# Patient Record
Sex: Female | Born: 1995 | Race: Black or African American | Hispanic: No | Marital: Single | Smoking: Never smoker
Health system: Southern US, Community
[De-identification: ages and names within clinical notes are randomized; demographics above are authoritative.]

---
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
## 2014-01-15 ENCOUNTER — Encounter (HOSPITAL_COMMUNITY): Payer: Self-pay | Admitting: Emergency Medicine

## 2014-01-15 ENCOUNTER — Emergency Department (INDEPENDENT_AMBULATORY_CARE_PROVIDER_SITE_OTHER)
Admission: EM | Admit: 2014-01-15 | Discharge: 2014-01-15 | Disposition: A | Discharge status: Home / Self Care | Payer: Medicaid Other | Source: Home / Self Care | Attending: Family Medicine | Admitting: Family Medicine

## 2014-01-15 DIAGNOSIS — R599 Enlarged lymph nodes, unspecified: Secondary | ICD-10-CM

## 2014-01-15 DIAGNOSIS — R591 Generalized enlarged lymph nodes: Secondary | ICD-10-CM

## 2014-01-15 DIAGNOSIS — R198 Other specified symptoms and signs involving the digestive system and abdomen: Secondary | ICD-10-CM

## 2014-01-15 LAB — POCT RAPID STREP A: Streptococcus, Group A Screen (Direct): NEGATIVE

## 2014-01-15 MED ORDER — POLYETHYLENE GLYCOL 3350 17 G PO PACK: 17.0000 g | PACK | Freq: Every day | ORAL | Status: AC

## 2014-01-15 NOTE — ED Provider Notes (Signed)
Medical screening examination/treatment/procedure(s) were performed by a resident physician or non-physician practitioner and as the supervising physician I was immediately available for consultation/collaboration.  Sahar Ryback, MD    Rosely Fernandez S Aseret Hoffman, MD 01/15/14 2119 

## 2014-01-15 NOTE — Discharge Instructions (Signed)
Take ibuprofen as needed for the sore lymph node in your neck. Come back in one week if this has not resolved.  Cervical Adenitis You have a swollen lymph gland in your neck. This commonly happens with Strep and virus infections, dental problems, insect bites, and injuries about the face, scalp, or neck. The lymph glands swell as the body fights the infection or heals the injury. Swelling and firmness typically lasts for several weeks after the infection or injury is healed. Rarely lymph glands can become swollen because of cancer or TB. Antibiotics are prescribed if there is evidence of an infection. Sometimes an infected lymph gland becomes filled with pus. This condition may require opening up the abscessed gland by draining it surgically. Most of the time infected glands return to normal within two weeks. Do not poke or squeeze the swollen lymph nodes. That may keep them from shrinking back to their normal size. If the lymph gland is still swollen after 2 weeks, further medical evaluation is needed.  SEEK IMMEDIATE MEDICAL CARE IF:  You have difficulty swallowing or breathing, increased swelling, severe pain, or a high fever.  Document Released: 09/11/2005 Document Revised: 12/04/2011 Document Reviewed: 03/03/2007 [facility] Patient Information 2014 [employer], [place].

## 2014-01-15 NOTE — ED Provider Notes (Signed)
CSN: [id number]     Arrival date & time 01/15/14  1003 History   First MD Initiated Contact with Patient 01/15/14 1048     Chief Complaint  Patient presents with  . Sore Throat   (Consider location/radiation/quality/duration/timing/severity/associated sxs/prior Treatment) HPI Comments: 18 year old female presents for evaluation of sore throat and for constipation and diarrhea. She has had a mild sore throat and a swollen lymph node on the right side of her neck for one week. She also is having some postnasal drip with very mild sinus pressure. She does not feel particularly sick and has not had any fever, chills, vomiting, cough, shortness of breath. She also has noted some alternating constipation and diarrhea for one week. She feels constipated all the time and has to strain to have a bowel movement. She had a very small amount of blood on the toilet paper one time after straining very hard but has had no blood otherwise. She has some mild cramping abdominal pain because of her period.  Patient is a 18 y.o. female presenting with pharyngitis.  Sore Throat Associated symptoms include abdominal pain.    History reviewed. No pertinent past medical history. History reviewed. No pertinent past surgical history. History reviewed. No pertinent family history. History  Substance Use Topics  . Smoking status: Never Smoker   . Smokeless tobacco: Not on file  . Alcohol Use: No   OB History   Grav Para Term Preterm Abortions TAB SAB Ect Mult Living                 Review of Systems  HENT: Positive for postnasal drip, sinus pressure and sore throat.   Gastrointestinal: Positive for abdominal pain, diarrhea, constipation and anal bleeding. Negative for nausea, vomiting, blood in stool and rectal pain.  Hematological: Positive for adenopathy.  All other systems reviewed and are negative.   Allergies  Review of patient's allergies indicates no known allergies.  Home Medications   Prior to  Admission medications   Medication Sig Start Date End Date Taking? Authorizing Provider  traMADol (ULTRAM) 50 MG tablet Take 1 tablet (50 mg total) by mouth every 6 (six) hours as needed. 12/15/13   Graylon GoodZachary H Mazey Mantell, PA-C   BP 126/73  Pulse 75  Temp(Src) 98.6 F (37 C) (Oral)  Resp 14  SpO2 99%  LMP 01/15/2014 Physical Exam  Nursing note and vitals reviewed. Constitutional: She is oriented to person, place, and time. Vital signs are normal. She appears well-developed and well-nourished. No distress.  HENT:  Head: Normocephalic and atraumatic.  Right Ear: External ear normal.  Left Ear: External ear normal.  Nose: Nose normal.  Mouth/Throat: Oropharynx is clear and moist. No oropharyngeal exudate.  Eyes: Conjunctivae are normal. Right eye exhibits no discharge.  Neck: Normal range of motion. Neck supple.  Pulmonary/Chest: Effort normal and breath sounds normal. No respiratory distress.  Abdominal: Soft. She exhibits no mass. There is no tenderness. There is no rebound and no guarding.  Lymphadenopathy:       Head (right side): Tonsillar adenopathy present.       Head (left side): No tonsillar adenopathy present.    She has cervical adenopathy (mild).       Right cervical: Posterior cervical adenopathy present.       Left cervical: Posterior cervical adenopathy present.  Neurological: She is alert and oriented to person, place, and time. She has normal strength. Coordination normal.  Skin: Skin is warm and dry. No rash noted. She is not  diaphoretic.  Psychiatric: She has a normal mood and affect. Judgment normal.    ED Course  Procedures (including critical care time) Labs Review Labs Reviewed  POCT RAPID STREP A ([facility] ONLY)    Results for orders placed during the hospital encounter of 01/15/14  POCT RAPID STREP A ([facility] ONLY)      Result Value Ref Range   Streptococcus, Group A Screen (Direct) NEGATIVE  NEGATIVE   Imaging Review No results found.   MDM    1. Lymphadenopathy   2. Alternating constipation and diarrhea    Right Tonsillar node is minimally tender. It is nonfluctuant and there is no surrounding erythema or induration. There is no signs of any other upper respiratory infection. Treat symptomatically with ibuprofen, followup if this has not resolved in one week.   Her main issue is with the constipation, treated with MiraLAX.   Followup when necessary  Meds ordered this encounter  Medications  . polyethylene glycol (MIRALAX) packet    Sig: Take 17 g by mouth daily.    Dispense:  10 each    Refill:  0    Order Specific Question:  Supervising Provider    Answer:  Clementeen GrahamOREY, EVAN, Kathie RhodesS [[id number]]      Graylon GoodZachary H Henritta Mutz, PA-C 01/15/14 1112

## 2014-01-15 NOTE — ED Notes (Signed)
Pt  Reports  Symptoms  Of   sorethroat       Sinus  Drainage  As  Well  As  intermittant  Constipation /  Diarrhea        Pt  Reports  The  Symptoms  For  About 1  Week     She  Is  Sitting  Upright  On the  Exam table  Speaking in  Complete  sentances  And  Is  In no  Distress

## 2014-01-17 LAB — CULTURE, GROUP A STREP

## 2015-08-16 IMAGING — CR DG FOREARM 2V*R*
2 series · 2 of 2 positions shown · non-contrast
Comparison: None.

CLINICAL DATA: 18-year-old female with forearm pain.

EXAM:
RIGHT FOREARM - 2 VIEW

[view not recorded (1 of 2)]
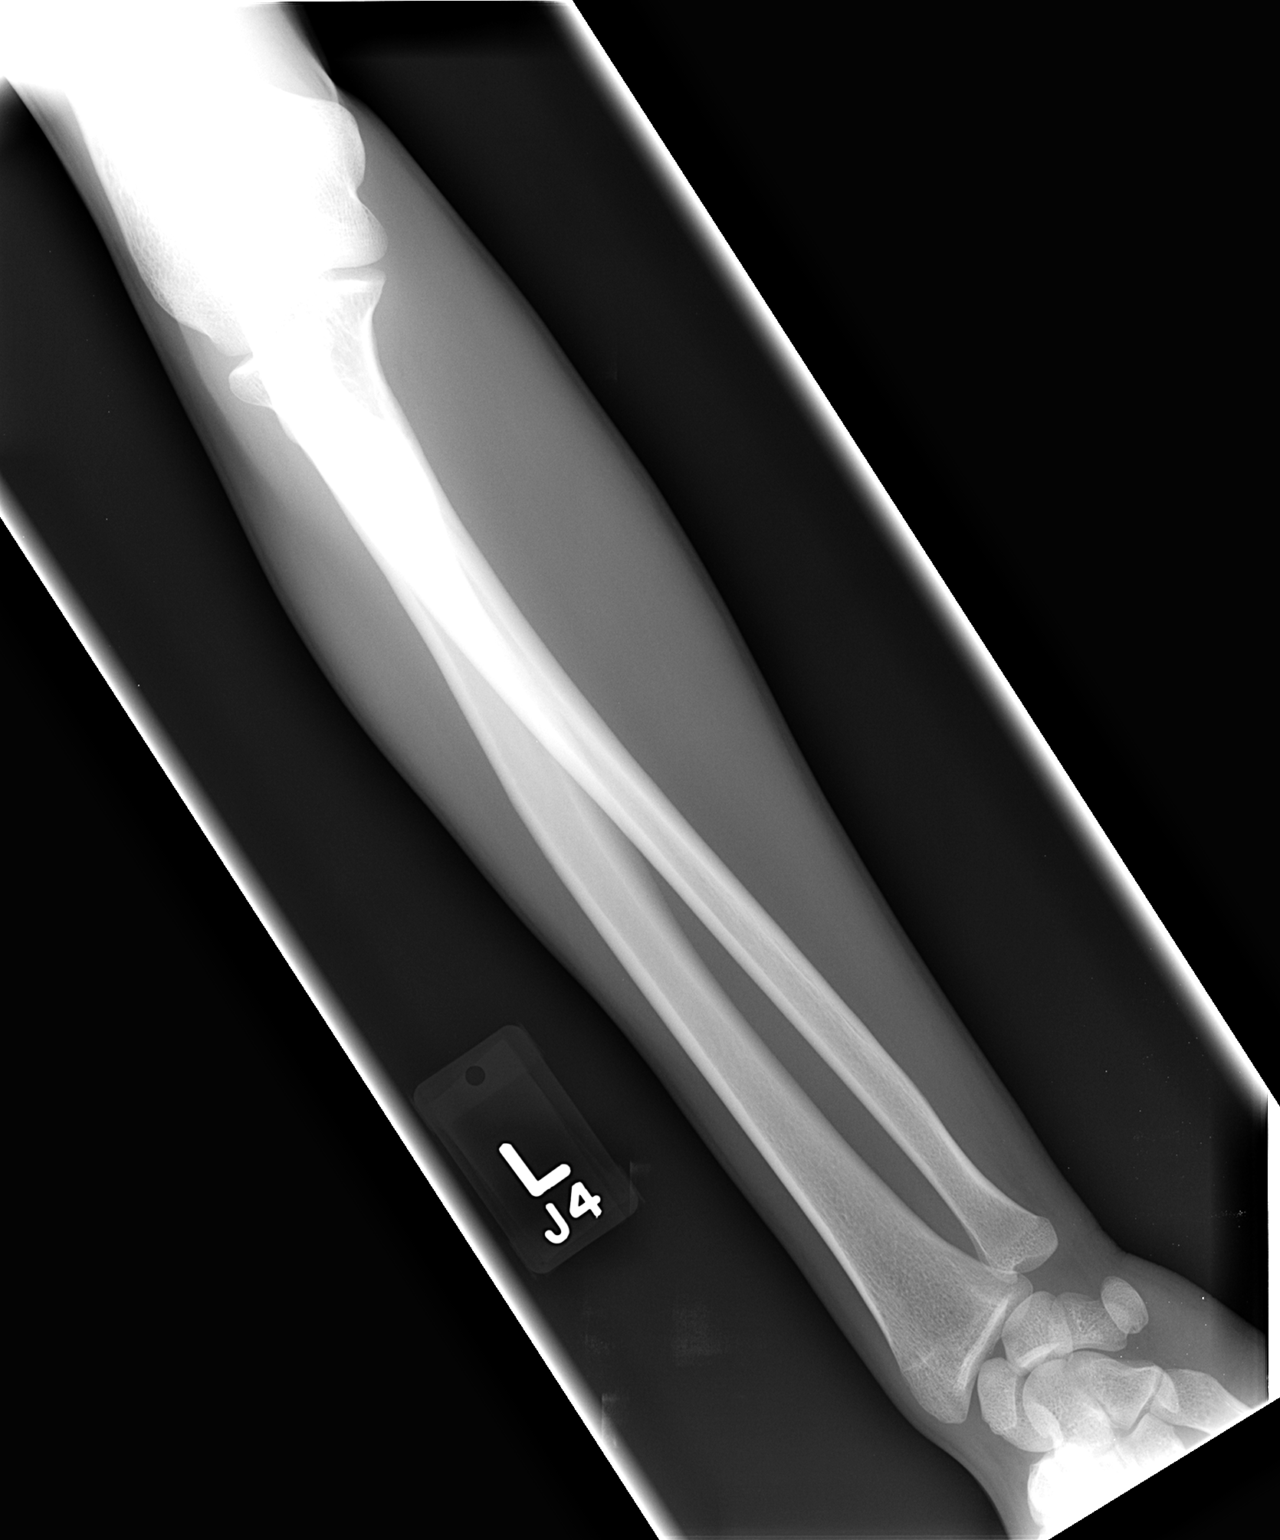

[view not recorded (2 of 2)]
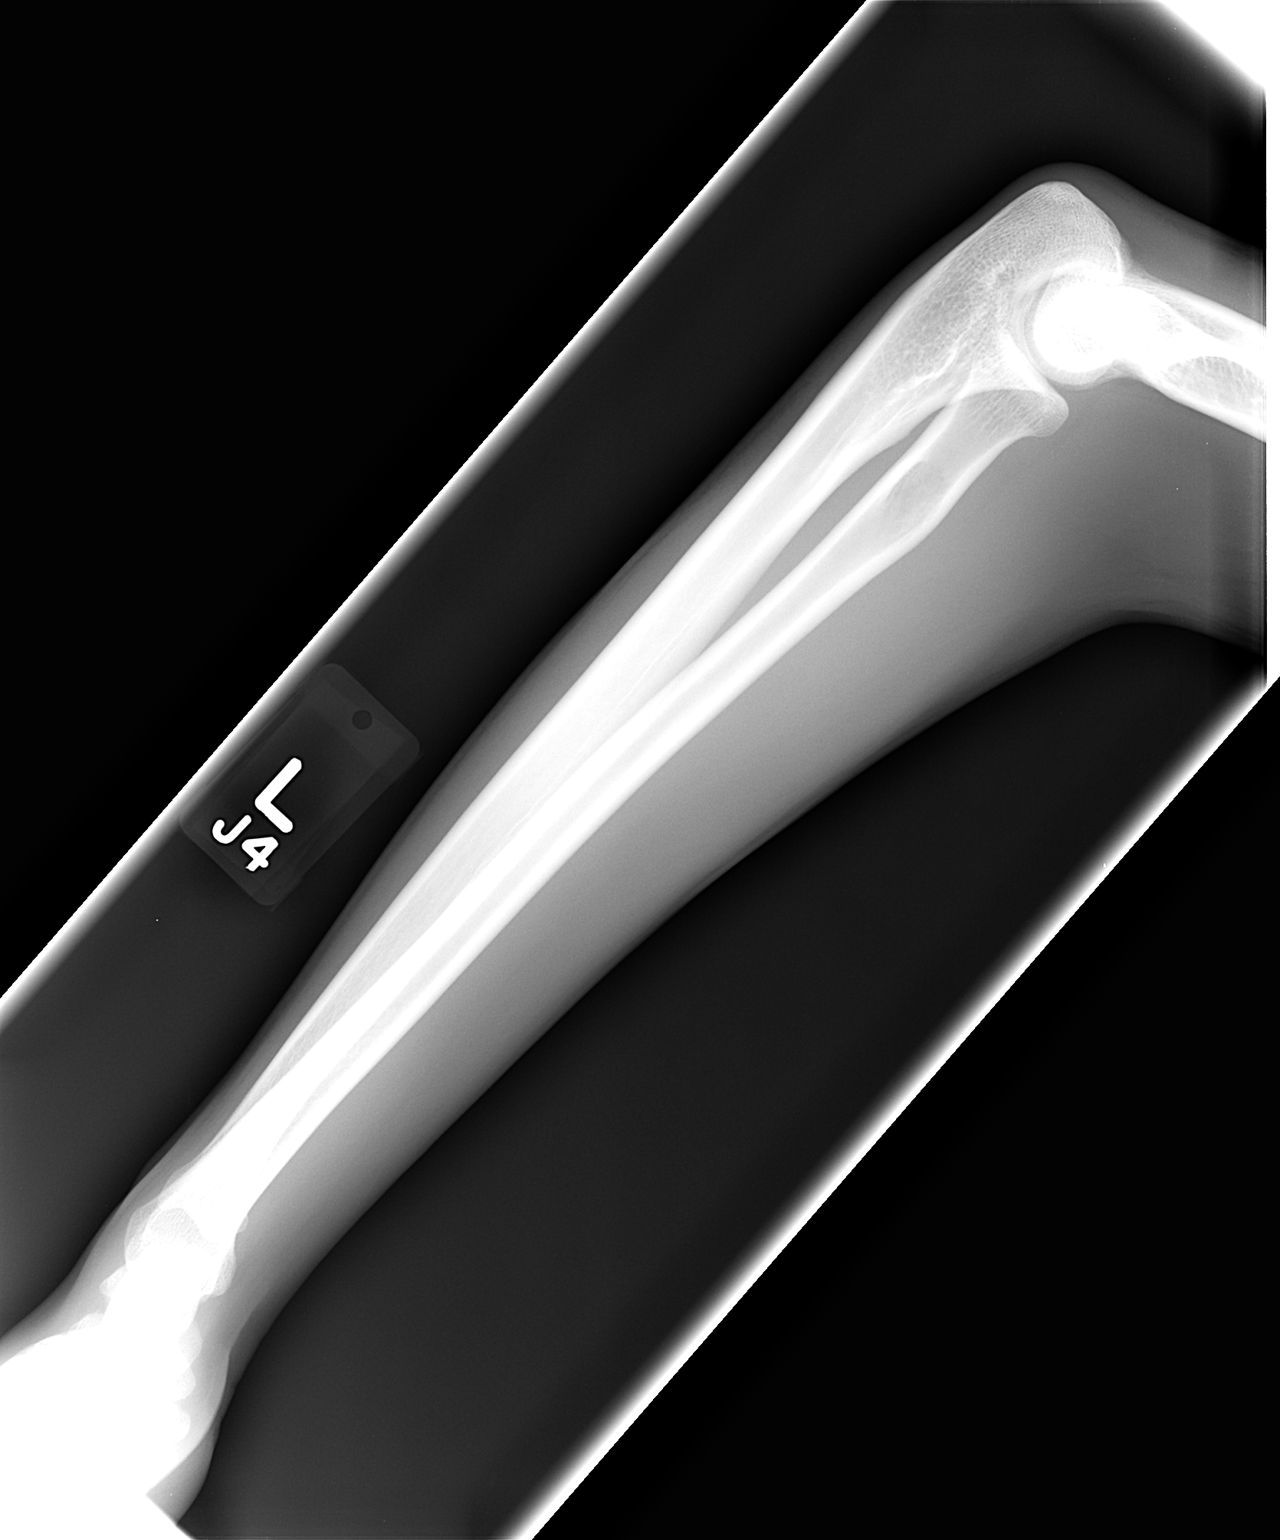

[2 of 2 positions shown; findings below may reference images not displayed]

FINDINGS: There is no evidence of fracture or other focal bone lesions. Soft
tissues are unremarkable.
IMPRESSION: Negative.
